# Patient Record
Sex: Male | Born: 1935 | Race: White | Hispanic: No | Marital: Married | Smoking: Never smoker
Health system: Southern US, Community
[De-identification: ages and names within clinical notes are randomized; demographics above are authoritative.]

## PROBLEM LIST (undated history)

## (undated) DIAGNOSIS — K219 Gastro-esophageal reflux disease without esophagitis: Secondary | ICD-10-CM

## (undated) DIAGNOSIS — F419 Anxiety disorder, unspecified: Secondary | ICD-10-CM

## (undated) DIAGNOSIS — I10 Essential (primary) hypertension: Secondary | ICD-10-CM

## (undated) DIAGNOSIS — C801 Malignant (primary) neoplasm, unspecified: Secondary | ICD-10-CM

## (undated) HISTORY — PX: PENILE PROSTHESIS IMPLANT: SHX240

## (undated) HISTORY — PX: BACK SURGERY: SHX140

## (undated) HISTORY — PX: OTHER SURGICAL HISTORY: SHX169

## (undated) HISTORY — PX: TRANSURETHRAL RESECTION OF BLADDER TUMOR WITH GYRUS (TURBT-GYRUS): SHX6458

## (undated) HISTORY — PX: APPENDECTOMY: SHX54

## (undated) HISTORY — PX: HERNIA REPAIR: SHX51

---

## 2015-08-01 ENCOUNTER — Other Ambulatory Visit (HOSPITAL_COMMUNITY): Payer: Self-pay | Admitting: Internal Medicine

## 2015-08-01 DIAGNOSIS — N289 Disorder of kidney and ureter, unspecified: Secondary | ICD-10-CM

## 2015-08-05 ENCOUNTER — Ambulatory Visit (HOSPITAL_COMMUNITY)
Admission: RE | Admit: 2015-08-05 | Discharge: 2015-08-05 | Disposition: A | Discharge status: Home / Self Care | Payer: Medicare Other | Source: Ambulatory Visit | Attending: Internal Medicine | Admitting: Internal Medicine

## 2015-08-05 DIAGNOSIS — N329 Bladder disorder, unspecified: Secondary | ICD-10-CM | POA: Insufficient documentation

## 2015-08-05 DIAGNOSIS — N400 Enlarged prostate without lower urinary tract symptoms: Secondary | ICD-10-CM | POA: Diagnosis not present

## 2015-08-05 DIAGNOSIS — N289 Disorder of kidney and ureter, unspecified: Secondary | ICD-10-CM | POA: Diagnosis not present

## 2015-08-05 DIAGNOSIS — N281 Cyst of kidney, acquired: Secondary | ICD-10-CM | POA: Diagnosis not present

## 2015-08-12 ENCOUNTER — Other Ambulatory Visit (HOSPITAL_COMMUNITY): Payer: Self-pay | Admitting: Ophthalmology

## 2015-08-12 DIAGNOSIS — H5340 Unspecified visual field defects: Secondary | ICD-10-CM

## 2015-08-22 ENCOUNTER — Other Ambulatory Visit (HOSPITAL_COMMUNITY): Payer: Self-pay | Admitting: Diagnostic Radiology

## 2015-08-22 ENCOUNTER — Other Ambulatory Visit (HOSPITAL_COMMUNITY): Payer: Self-pay | Admitting: Ophthalmology

## 2015-08-22 ENCOUNTER — Ambulatory Visit (HOSPITAL_COMMUNITY)
Admission: RE | Admit: 2015-08-22 | Discharge: 2015-08-22 | Disposition: A | Discharge status: Home / Self Care | Payer: Medicare Other | Source: Ambulatory Visit | Attending: Ophthalmology | Admitting: Ophthalmology

## 2015-08-22 ENCOUNTER — Ambulatory Visit (HOSPITAL_COMMUNITY)
Admission: RE | Admit: 2015-08-22 | Discharge: 2015-08-22 | Disposition: A | Discharge status: Home / Self Care | Payer: Medicare Other | Source: Ambulatory Visit | Attending: Diagnostic Radiology | Admitting: Diagnostic Radiology

## 2015-08-22 DIAGNOSIS — S0540XA Penetrating wound of orbit with or without foreign body, unspecified eye, initial encounter: Secondary | ICD-10-CM

## 2015-08-22 DIAGNOSIS — H5340 Unspecified visual field defects: Secondary | ICD-10-CM

## 2015-08-22 DIAGNOSIS — S0590XS Unspecified injury of unspecified eye and orbit, sequela: Secondary | ICD-10-CM | POA: Insufficient documentation

## 2015-08-22 DIAGNOSIS — D329 Benign neoplasm of meninges, unspecified: Secondary | ICD-10-CM | POA: Insufficient documentation

## 2015-08-22 DIAGNOSIS — G9389 Other specified disorders of brain: Secondary | ICD-10-CM | POA: Insufficient documentation

## 2015-08-22 DIAGNOSIS — H04123 Dry eye syndrome of bilateral lacrimal glands: Secondary | ICD-10-CM | POA: Diagnosis not present

## 2015-08-22 DIAGNOSIS — H527 Unspecified disorder of refraction: Secondary | ICD-10-CM | POA: Insufficient documentation

## 2015-08-22 DIAGNOSIS — Z1810 Retained metal fragments, unspecified: Secondary | ICD-10-CM | POA: Diagnosis not present

## 2015-08-22 DIAGNOSIS — H35372 Puckering of macula, left eye: Secondary | ICD-10-CM | POA: Insufficient documentation

## 2015-08-22 DIAGNOSIS — H25812 Combined forms of age-related cataract, left eye: Secondary | ICD-10-CM | POA: Diagnosis not present

## 2015-08-22 LAB — CREATININE, SERUM
Creatinine, Ser: 1.23 mg/dL (ref 0.61–1.24)
GFR calc non Af Amer: 53 mL/min — ABNORMAL LOW (ref >60)

## 2015-08-22 MED ORDER — GADOBENATE DIMEGLUMINE 529 MG/ML IV SOLN
17.0000 mL | Freq: Once | INTRAVENOUS | Status: AC | PRN
  Administered 2015-08-22: 17 mL via INTRAVENOUS

## 2015-08-23 ENCOUNTER — Other Ambulatory Visit (HOSPITAL_COMMUNITY): Payer: Medicare Other

## 2016-10-29 ENCOUNTER — Other Ambulatory Visit: Payer: Self-pay | Admitting: Urology

## 2016-11-05 NOTE — Patient Instructions (Signed)
Jay Scott  11/05/2016   Your procedure is scheduled on: 11/08/16    Report to [facility] Main  Entrance Take Jay Scott  elevators to 3rd floor to  Jay Scott at    1000 AM.   F  Call this number if you have problems the morning of surgery [phone]    Remember: ONLY 1 PERSON MAY GO WITH YOU TO SHORT STAY TO GET  READY MORNING OF YOUR SURGERY.  Do not eat food or drink liquids :After Midnight.     Take these medicines the morning of surgery with A SIP OF WATER: celexa, eye  Drops as usual                                 You may not have any metal on your body including hair pins and              piercings  Do not wear jewelry,  lotions, powders or perfumes, deodorant                        Men may shave face and neck.   Do not bring valuables to the hospital. Jay Scott.  Contacts, dentures or bridgework may not be worn into surgery.      Patients discharged the day of surgery will not be allowed to drive home.  Name and phone number of your driver:                Please read over the following fact sheets you were given: _____________________________________________________________________             Jay Scott - Preparing for Surgery Before surgery, you can play an important role.  Because skin is not sterile, your skin needs to be as free of germs as possible.  You can reduce the number of germs on your skin by washing with CHG (chlorahexidine gluconate) soap before surgery.  CHG is an antiseptic cleaner which kills germs and bonds with the skin to continue killing germs even after washing. Please DO NOT use if you have an allergy to CHG or antibacterial soaps.  If your skin becomes reddened/irritated stop using the CHG and inform your nurse when you arrive at Short Stay. Do not shave (including legs and underarms) for at least 48 hours prior to the first CHG shower.  You may shave  your face/neck. Please follow these instructions carefully:  1.  Shower with CHG Soap the night before surgery and the  morning of Surgery.  2.  If you choose to wash your hair, wash your hair first as usual with your  normal  shampoo.  3.  After you shampoo, rinse your hair and body thoroughly to remove the  shampoo.                           4.  Use CHG as you would any other liquid soap.  You can apply chg directly  to the skin and wash                       Gently with a scrungie or clean washcloth.  5.  Apply the CHG Soap to your body ONLY FROM THE NECK DOWN.   Do not use on face/ open                           Wound or open sores. Avoid contact with eyes, ears mouth and genitals (private parts).                       Wash face,  Genitals (private parts) with your normal soap.             6.  Wash thoroughly, paying special attention to the area where your surgery  will be performed.  7.  Thoroughly rinse your body with warm water from the neck down.  8.  DO NOT shower/wash with your normal soap after using and rinsing off  the CHG Soap.                9.  Pat yourself dry with a clean towel.            10.  Wear clean pajamas.            11.  Place clean sheets on your bed the night of your first shower and do not  sleep with pets. Day of Surgery : Do not apply any lotions/deodorants the morning of surgery.  Please wear clean clothes to the hospital/surgery center.  FAILURE TO FOLLOW THESE INSTRUCTIONS MAY RESULT IN THE CANCELLATION OF YOUR SURGERY PATIENT SIGNATURE_________________________________  NURSE SIGNATURE__________________________________  ________________________________________________________________________

## 2016-11-06 ENCOUNTER — Encounter (HOSPITAL_COMMUNITY)
Admission: RE | Admit: 2016-11-06 | Discharge: 2016-11-06 | Disposition: A | Discharge status: Home / Self Care | Payer: Medicare Other | Source: Ambulatory Visit | Attending: Urology | Admitting: Urology

## 2016-11-06 ENCOUNTER — Encounter (HOSPITAL_COMMUNITY): Payer: Self-pay

## 2016-11-06 DIAGNOSIS — C675 Malignant neoplasm of bladder neck: Secondary | ICD-10-CM | POA: Diagnosis present

## 2016-11-06 DIAGNOSIS — I10 Essential (primary) hypertension: Secondary | ICD-10-CM | POA: Diagnosis not present

## 2016-11-06 DIAGNOSIS — N138 Other obstructive and reflux uropathy: Secondary | ICD-10-CM | POA: Diagnosis not present

## 2016-11-06 DIAGNOSIS — Z79899 Other long term (current) drug therapy: Secondary | ICD-10-CM | POA: Diagnosis not present

## 2016-11-06 DIAGNOSIS — N320 Bladder-neck obstruction: Secondary | ICD-10-CM | POA: Diagnosis not present

## 2016-11-06 DIAGNOSIS — N401 Enlarged prostate with lower urinary tract symptoms: Secondary | ICD-10-CM | POA: Diagnosis not present

## 2016-11-06 DIAGNOSIS — Z23 Encounter for immunization: Secondary | ICD-10-CM | POA: Diagnosis not present

## 2016-11-06 DIAGNOSIS — F419 Anxiety disorder, unspecified: Secondary | ICD-10-CM | POA: Diagnosis not present

## 2016-11-06 DIAGNOSIS — K219 Gastro-esophageal reflux disease without esophagitis: Secondary | ICD-10-CM | POA: Diagnosis not present

## 2016-11-06 DIAGNOSIS — G4730 Sleep apnea, unspecified: Secondary | ICD-10-CM | POA: Diagnosis not present

## 2016-11-06 DIAGNOSIS — Z95810 Presence of automatic (implantable) cardiac defibrillator: Secondary | ICD-10-CM | POA: Diagnosis not present

## 2016-11-06 HISTORY — DX: Gastro-esophageal reflux disease without esophagitis: K21.9

## 2016-11-06 HISTORY — DX: Anxiety disorder, unspecified: F41.9

## 2016-11-06 HISTORY — DX: Essential (primary) hypertension: I10

## 2016-11-06 HISTORY — DX: Malignant (primary) neoplasm, unspecified: C80.1

## 2016-11-06 LAB — CBC
HEMATOCRIT: 44.2 % (ref 39.0–52.0)
HEMOGLOBIN: 15.7 g/dL (ref 13.0–17.0)
MCH: 34.3 pg — ABNORMAL HIGH (ref 26.0–34.0)
MCHC: 35.5 g/dL (ref 30.0–36.0)
MCV: 96.5 fL (ref 78.0–100.0)
Platelets: 230 10*3/uL (ref 150–400)
RBC: 4.58 MIL/uL (ref 4.22–5.81)
RDW: 13 % (ref 11.5–15.5)
WBC: 8.6 10*3/uL (ref 4.0–10.5)

## 2016-11-06 LAB — BASIC METABOLIC PANEL
ANION GAP: 8 (ref 5–15)
BUN: 33 mg/dL — ABNORMAL HIGH (ref 6–20)
CHLORIDE: 103 mmol/L (ref 101–111)
CO2: 27 mmol/L (ref 22–32)
Calcium: 9.3 mg/dL (ref 8.9–10.3)
Creatinine, Ser: 1.16 mg/dL (ref 0.61–1.24)
GFR calc non Af Amer: 57 mL/min — ABNORMAL LOW (ref >60)
GLUCOSE: 159 mg/dL — AB (ref 65–99)
Potassium: 4.8 mmol/L (ref 3.5–5.1)
Sodium: 138 mmol/L (ref 135–145)

## 2016-11-06 NOTE — Progress Notes (Signed)
BMP done 11/06/16 routed via epic to dr Jeffie Pollock.

## 2016-11-06 NOTE — H&P (Signed)
CC/HPI: I have symptoms of an enlarged prostate.     Mr. Jay Scott is an 83 yo WM who presents for increased voiding symptoms. He has had progressive symptoms over the last 3-4 years and he has nocturia 6x most nights. He has been on flomax for a couple of months with some improvement. He has no hesitancy and a good stream. He has some urgency and can have some accidents. He has some intermittency and doesn't always feel empty. He has had no hematuria or dysuria. 3-4 days ago he did have a single episode of dysuria and that was after increased soda intake. He had chlamydia about 6 years ago that was treated. He has had bladder cancer that was treated about 6 years ago. He had 3 low grade tumors. He last had cystoscopy a year ago.     ALLERGIES: None   MEDICATIONS: Flomax 0.4 mg capsule, ext release 24 hr  Losartan Potassium 100 mg tablet  Spironolactone 25 mg tablet  Tylenol 325 mg tablet     GU PSH: Cystoscopy - about 10/23/2015 Cystoscopy TURBT <2 cm - about 2012 Insertion IPP - about 2016, about 2003      PSH Notes: right eye enucleation 1994   NON-GU PSH: Back surgery Brain surgery - about 1995 Skin Biopsy    GU PMH: None   NON-GU PMH: Basal cell carcinoma of skin of unspecified parts of face Benign neoplasm of meninges, unspecified Depression Hypertension Sleep Apnea    FAMILY HISTORY: 1 Daughter - Daughter 3 Son's - Son   SOCIAL HISTORY: Marital Status: Married Preferred Language: English; Race: White Current Smoking Status: Patient has never smoked.   Tobacco Use Assessment Completed: Used Tobacco in last 30 days? Drinks 1 drink per day. Types of alcohol consumed: Beer. Social Drinker.  Drinks 2 caffeinated drinks per day.     Notes: Retired Pharmacist, community.    REVIEW OF SYSTEMS:    GU Review Male:   Patient reports frequent urination, hard to postpone urination, get up at night to urinate, leakage of urine, and stream starts and stops. Patient denies burning/ pain  with urination, trouble starting your stream, have to strain to urinate , and penile pain.  Gastrointestinal (Upper):   Patient denies nausea, vomiting, and indigestion/ heartburn.  Gastrointestinal (Lower):   Patient denies diarrhea and constipation.  Constitutional:   Patient denies fever, night sweats, weight loss, and fatigue.  Skin:   Patient denies skin rash/ lesion and itching.  Eyes:   Patient reports blurred vision. Patient denies double vision.  Ears/ Nose/ Throat:   Patient denies sore throat and sinus problems.  Hematologic/Lymphatic:   Patient denies swollen glands and easy bruising.  Cardiovascular:   Patient denies leg swelling and chest pains.  Respiratory:   Patient denies cough and shortness of breath.  Endocrine:   Patient denies excessive thirst.  Musculoskeletal:   Patient reports back pain and joint pain.   Neurological:   Patient reports dizziness. Patient denies headaches.  Psychologic:   Patient denies depression and anxiety.   VITAL SIGNS:      10/24/2016 03:00 PM  Weight 169.7 lb / 76.97 kg  Height 69 in / 175.26 cm  BP 104/65 mmHg  Pulse 61 /min  BMI 25.1 kg/m   GU PHYSICAL EXAMINATION:    Anus and Perineum: No hemorrhoids. No anal stenosis. No rectal fissure, no anal fissure. No edema, no dimple, no perineal tenderness, no anal tenderness.  Scrotum: No lesions. No edema. No cysts. No warts.  IPP pump in the right hemiscrotum.   Epididymides: Right: no spermatocele, no masses, no cysts, no tenderness, no induration, no enlargement. Left: no spermatocele, no masses, no cysts, no tenderness, no induration, no enlargement.  Testes: No tenderness, no swelling, no enlargement left testes. No tenderness, no swelling, no enlargement right testes. Normal location left testes. Normal location right testes. No mass, no cyst, no varicocele, no hydrocele left testes. No mass, no cyst, no varicocele, no hydrocele right testes.  Urethral Meatus: Normal size. No lesion, no  wart, no discharge, no polyp. Normal location.  Penis: Circumcised, no foreskin warts, no cracks. No dorsal peyronie's plaques, no left corporal peyronie's plaques, no right corporal peyronie's plaques, no scarring, no shaft warts. No balanitis, no meatal stenosis. inflatable prosthesis in place.   Prostate: Prostate 3 + size. Left lobe normal consistency, right lobe normal consistency. Symmetrical lobes. No prostate nodule. Left lobe no tenderness, right lobe no tenderness.   Seminal Vesicles: Nonpalpable.  Sphincter Tone: Normal sphincter. No rectal tenderness. No rectal mass.    MULTI-SYSTEM PHYSICAL EXAMINATION:    Constitutional: Well-nourished. No physical deformities. Normally developed. Good grooming.  Neck: Neck symmetrical, not swollen. Normal tracheal position.  Respiratory: No labored breathing, no use of accessory muscles.   Cardiovascular: Normal temperature, normal extremity pulses, no swelling, no varicosities.  Lymphatic: No enlargement of neck, axillae, groin.  Skin: No paleness, no jaundice, no cyanosis. No lesion, no ulcer, no rash.  Neurologic / Psychiatric: Oriented to time, oriented to place, oriented to person. No depression, no anxiety, no agitation.  Gastrointestinal: No mass, no tenderness, no rigidity, non obese abdomen. he has right and left inguinal incisions for the IPP.   Eyes: Normal conjunctivae. Normal eyelids.  Ears, Nose, Mouth, and Throat: Left ear no scars, no lesions, no masses. Right ear no scars, no lesions, no masses. Nose no scars, no lesions, no masses. Normal hearing. Normal lips.  Musculoskeletal: Normal gait and station of head and neck.     PAST DATA REVIEWED:  Source Of History:  Patient  Urine Test Review:   Urinalysis  X-Ray Review: Renal Ultrasound: Reviewed Report. Discussed With Patient.     PROCEDURES:         Flexible Cystoscopy - 52000  Risks, benefits, and some of the potential complications of the procedure were discussed. 97ml  of 2% lidocaine jelly was instilled intraurethrally.  Cipro 500mg  given for antibiotic prophylaxis.     Meatus:  Normal size. Normal location. Normal condition.  Urethra:  No strictures.  External Sphincter:  Normal.  Verumontanum:  Normal.  Prostate:  Obstructing. Enlarged median lobe. Severe hyperplasia. with shaggy mucosa on the middle lobe and bladder neck that could be CIS.   Bladder Neck:  Non-obstructing.  Ureteral Orifices:  Normal location. Normal size. Normal shape. Effluxed clear urine.  Bladder:  Moderate trabeculation. A posterior wall tumor. Erythematous mucosa. the posterior wall tumor is most consistent with CIS and is about 1-1.5cm. There are a couple of other smaller areas in the surrounding posterior wall that are also suspicious for CIS. No stones.      The procedure was well tolerated and there were no complications.          Flow Rate - 51741  Flow Time: 0:26 min:sec  Voided Volume: 152 cc  Peak Flow Rate: 7 cc/sec  Time of Peak Flow: 0:09 min:sec  Average Flow Rate: 5 cc/sec  Total Void Time: 0:26 min:sec  PVR Ultrasound - 36644  Scanned Volume: 78 cc         Urinalysis Dipstick Dipstick Cont'd  Color: Yellow Bilirubin: Neg  Appearance: Clear Ketones: Neg  Specific Gravity: 1.025 Blood: Neg  pH: 6.0 Protein: Neg  Glucose: Neg Urobilinogen: 0.2    Nitrites: Neg    Leukocyte Esterase: Neg    ASSESSMENT:      ICD-10 Details  1 GU:   BPH w/LUTS - N40.1 He has a large prostate with trilobar obstruction. He will need at least partial resection because of the mucosal abnormalities.   2   Nocturia - R35.1 The OAB is probably a combination of the BPH with BOO and recurrent bladder cancer.   3   Bladder Cancer Posterior - C67.4 He has a small papillary tumor with surrounding changes consistent with CIS. He is going to need cystoscopy with retrogrades and a TURBT. I am going to try to get him back in for a CT hematuria study this week to assess the  upper tracks and r/o mets.   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 Hematuria With and Without I.V. Contrast - Needs to be done this week.  Return Visit/Planned Activity: ASAP - Schedule Surgery

## 2016-11-06 NOTE — Progress Notes (Signed)
Final EKG done 11/06/16-epic

## 2016-11-07 NOTE — H&P (Signed)
CC/HPI: I have symptoms of an enlarged prostate.     Jay Scott is an 83 yo WM who presents for increased voiding symptoms. He has had progressive symptoms over the last 3-4 years and he has nocturia 6x most nights. He has been on flomax for a couple of months with some improvement. He has no hesitancy and a good stream. He has some urgency and can have some accidents. He has some intermittency and doesn't always feel empty. He has had no hematuria or dysuria. 3-4 days ago he did have a single episode of dysuria and that was after increased soda intake. He had chlamydia about 6 years ago that was treated. He has had bladder cancer that was treated about 6 years ago. He had 3 low grade tumors. He last had cystoscopy a year ago.     ALLERGIES: None   MEDICATIONS: Flomax 0.4 mg capsule, ext release 24 hr  Losartan Potassium 100 mg tablet  Spironolactone 25 mg tablet  Tylenol 325 mg tablet     GU PSH: Cystoscopy - about 10/23/2015 Cystoscopy TURBT <2 cm - about 2012 Insertion IPP - about 2016, about 2003      PSH Notes: right eye enucleation 1994   NON-GU PSH: Back surgery Brain surgery - about 1995 Skin Biopsy    GU PMH: None   NON-GU PMH: Basal cell carcinoma of skin of unspecified parts of face Benign neoplasm of meninges, unspecified Depression Hypertension Sleep Apnea    FAMILY HISTORY: 1 Daughter - Daughter 3 Son's - Son   SOCIAL HISTORY: Marital Status: Married Preferred Language: English; Race: White Current Smoking Status: Patient has never smoked.   Tobacco Use Assessment Completed: Used Tobacco in last 30 days? Drinks 1 drink per day. Types of alcohol consumed: Beer. Social Drinker.  Drinks 2 caffeinated drinks per day.     Notes: Retired Pharmacist, community.    REVIEW OF SYSTEMS:    GU Review Male:   Patient reports frequent urination, hard to postpone urination, get up at night to urinate, leakage of urine, and stream starts and stops. Patient denies burning/ pain  with urination, trouble starting your stream, have to strain to urinate , and penile pain.  Gastrointestinal (Upper):   Patient denies nausea, vomiting, and indigestion/ heartburn.  Gastrointestinal (Lower):   Patient denies diarrhea and constipation.  Constitutional:   Patient denies fever, night sweats, weight loss, and fatigue.  Skin:   Patient denies skin rash/ lesion and itching.  Eyes:   Patient reports blurred vision. Patient denies double vision.  Ears/ Nose/ Throat:   Patient denies sore throat and sinus problems.  Hematologic/Lymphatic:   Patient denies swollen glands and easy bruising.  Cardiovascular:   Patient denies leg swelling and chest pains.  Respiratory:   Patient denies cough and shortness of breath.  Endocrine:   Patient denies excessive thirst.  Musculoskeletal:   Patient reports back pain and joint pain.   Neurological:   Patient reports dizziness. Patient denies headaches.  Psychologic:   Patient denies depression and anxiety.   VITAL SIGNS:      10/24/2016 03:00 PM  Weight 169.7 lb / 76.97 kg  Height 69 in / 175.26 cm  BP 104/65 mmHg  Pulse 61 /min  BMI 25.1 kg/m   GU PHYSICAL EXAMINATION:    Anus and Perineum: No hemorrhoids. No anal stenosis. No rectal fissure, no anal fissure. No edema, no dimple, no perineal tenderness, no anal tenderness.  Scrotum: No lesions. No edema. No cysts. No warts.  IPP pump in the right hemiscrotum.   Epididymides: Right: no spermatocele, no masses, no cysts, no tenderness, no induration, no enlargement. Left: no spermatocele, no masses, no cysts, no tenderness, no induration, no enlargement.  Testes: No tenderness, no swelling, no enlargement left testes. No tenderness, no swelling, no enlargement right testes. Normal location left testes. Normal location right testes. No mass, no cyst, no varicocele, no hydrocele left testes. No mass, no cyst, no varicocele, no hydrocele right testes.  Urethral Meatus: Normal size. No lesion, no  wart, no discharge, no polyp. Normal location.  Penis: Circumcised, no foreskin warts, no cracks. No dorsal peyronie's plaques, no left corporal peyronie's plaques, no right corporal peyronie's plaques, no scarring, no shaft warts. No balanitis, no meatal stenosis. inflatable prosthesis in place.   Prostate: Prostate 3 + size. Left lobe normal consistency, right lobe normal consistency. Symmetrical lobes. No prostate nodule. Left lobe no tenderness, right lobe no tenderness.   Seminal Vesicles: Nonpalpable.  Sphincter Tone: Normal sphincter. No rectal tenderness. No rectal mass.    MULTI-SYSTEM PHYSICAL EXAMINATION:    Constitutional: Well-nourished. No physical deformities. Normally developed. Good grooming.  Neck: Neck symmetrical, not swollen. Normal tracheal position.  Respiratory: No labored breathing, no use of accessory muscles.   Cardiovascular: Normal temperature, normal extremity pulses, no swelling, no varicosities.  Lymphatic: No enlargement of neck, axillae, groin.  Skin: No paleness, no jaundice, no cyanosis. No lesion, no ulcer, no rash.  Neurologic / Psychiatric: Oriented to time, oriented to place, oriented to person. No depression, no anxiety, no agitation.  Gastrointestinal: No mass, no tenderness, no rigidity, non obese abdomen. he has right and left inguinal incisions for the IPP.   Eyes: Normal conjunctivae. Normal eyelids.  Ears, Nose, Mouth, and Throat: Left ear no scars, no lesions, no masses. Right ear no scars, no lesions, no masses. Nose no scars, no lesions, no masses. Normal hearing. Normal lips.  Musculoskeletal: Normal gait and station of head and neck.     PAST DATA REVIEWED:  Source Of History:  Patient  Urine Test Review:   Urinalysis  X-Ray Review: Renal Ultrasound: Reviewed Report. Discussed With Patient.     PROCEDURES:         Flexible Cystoscopy - 52000  Risks, benefits, and some of the potential complications of the procedure were discussed. 91ml  of 2% lidocaine jelly was instilled intraurethrally.  Cipro 500mg  given for antibiotic prophylaxis.     Meatus:  Normal size. Normal location. Normal condition.  Urethra:  No strictures.  External Sphincter:  Normal.  Verumontanum:  Normal.  Prostate:  Obstructing. Enlarged median lobe. Severe hyperplasia. with shaggy mucosa on the middle lobe and bladder neck that could be CIS.   Bladder Neck:  Non-obstructing.  Ureteral Orifices:  Normal location. Normal size. Normal shape. Effluxed clear urine.  Bladder:  Moderate trabeculation. A posterior wall tumor. Erythematous mucosa. the posterior wall tumor is most consistent with CIS and is about 1-1.5cm. There are a couple of other smaller areas in the surrounding posterior wall that are also suspicious for CIS. No stones.      The procedure was well tolerated and there were no complications.          Flow Rate - 51741  Flow Time: 0:26 min:sec  Voided Volume: 152 cc  Peak Flow Rate: 7 cc/sec  Time of Peak Flow: 0:09 min:sec  Average Flow Rate: 5 cc/sec  Total Void Time: 0:26 min:sec  PVR Ultrasound - 58832  Scanned Volume: 78 cc         Urinalysis Dipstick Dipstick Cont'd  Color: Yellow Bilirubin: Neg  Appearance: Clear Ketones: Neg  Specific Gravity: 1.025 Blood: Neg  pH: 6.0 Protein: Neg  Glucose: Neg Urobilinogen: 0.2    Nitrites: Neg    Leukocyte Esterase: Neg    ASSESSMENT:      ICD-10 Details  1 GU:   BPH w/LUTS - N40.1 He has a large prostate with trilobar obstruction. He will need at least partial resection because of the mucosal abnormalities.   2   Nocturia - R35.1 The OAB is probably a combination of the BPH with BOO and recurrent bladder cancer.   3   Bladder Cancer Posterior - C67.4 He has a small papillary tumor with surrounding changes consistent with CIS. He is going to need cystoscopy with retrogrades and a TURBT. I am going to try to get him back in for a CT hematuria study this week to assess the  upper tracks and r/o mets.   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 Hematuria With and Without I.V. Contrast - Needs to be done this week.  Return Visit/Planned Activity: ASAP - Schedule Surgery    CT showed no upper tract abnormalities.   There was bladder wall thickening.  He has indeterminate liver and lung lesions and 3 month repeat CT's were recommended and have been ordered.

## 2016-11-08 ENCOUNTER — Ambulatory Visit (HOSPITAL_COMMUNITY): Payer: Medicare Other | Admitting: Anesthesiology

## 2016-11-08 ENCOUNTER — Encounter (HOSPITAL_COMMUNITY)
Admission: RE | Disposition: A | Discharge status: Home / Self Care | Payer: Self-pay | Source: Ambulatory Visit | Attending: Urology

## 2016-11-08 ENCOUNTER — Encounter (HOSPITAL_COMMUNITY): Payer: Self-pay | Admitting: Anesthesiology

## 2016-11-08 ENCOUNTER — Observation Stay (HOSPITAL_COMMUNITY)
Admission: RE | Admit: 2016-11-08 | Discharge: 2016-11-09 | Disposition: A | Discharge status: Home / Self Care | Payer: Medicare Other | Source: Ambulatory Visit | Attending: Urology | Admitting: Urology

## 2016-11-08 DIAGNOSIS — C675 Malignant neoplasm of bladder neck: Secondary | ICD-10-CM | POA: Diagnosis not present

## 2016-11-08 DIAGNOSIS — Z23 Encounter for immunization: Secondary | ICD-10-CM | POA: Diagnosis not present

## 2016-11-08 DIAGNOSIS — N401 Enlarged prostate with lower urinary tract symptoms: Secondary | ICD-10-CM | POA: Diagnosis not present

## 2016-11-08 DIAGNOSIS — Z79899 Other long term (current) drug therapy: Secondary | ICD-10-CM | POA: Insufficient documentation

## 2016-11-08 DIAGNOSIS — N320 Bladder-neck obstruction: Secondary | ICD-10-CM | POA: Insufficient documentation

## 2016-11-08 DIAGNOSIS — N138 Other obstructive and reflux uropathy: Secondary | ICD-10-CM | POA: Insufficient documentation

## 2016-11-08 DIAGNOSIS — I10 Essential (primary) hypertension: Secondary | ICD-10-CM | POA: Insufficient documentation

## 2016-11-08 DIAGNOSIS — G4730 Sleep apnea, unspecified: Secondary | ICD-10-CM | POA: Insufficient documentation

## 2016-11-08 DIAGNOSIS — K219 Gastro-esophageal reflux disease without esophagitis: Secondary | ICD-10-CM | POA: Insufficient documentation

## 2016-11-08 DIAGNOSIS — Z95810 Presence of automatic (implantable) cardiac defibrillator: Secondary | ICD-10-CM | POA: Insufficient documentation

## 2016-11-08 DIAGNOSIS — F419 Anxiety disorder, unspecified: Secondary | ICD-10-CM | POA: Insufficient documentation

## 2016-11-08 HISTORY — PX: TRANSURETHRAL RESECTION OF BLADDER TUMOR: SHX2575

## 2016-11-08 HISTORY — PX: TRANSURETHRAL RESECTION OF PROSTATE: SHX73

## 2016-11-08 SURGERY — TURP (TRANSURETHRAL RESECTION OF PROSTATE)
Anesthesia: General

## 2016-11-08 MED ORDER — LOSARTAN POTASSIUM 50 MG PO TABS
100.0000 mg | ORAL_TABLET | Freq: Every day | ORAL | Status: DC
  Administered 2016-11-08 – 2016-11-09 (×2): 100 mg via ORAL
  Filled 2016-11-08 (×2): qty 2

## 2016-11-08 MED ORDER — OXYCODONE HCL 5 MG PO TABS: 5.0000 mg | ORAL_TABLET | ORAL | Status: DC | PRN

## 2016-11-08 MED ORDER — SPIRONOLACTONE 25 MG PO TABS
25.0000 mg | ORAL_TABLET | Freq: Every day | ORAL | Status: DC
  Administered 2016-11-08 – 2016-11-09 (×2): 25 mg via ORAL
  Filled 2016-11-08 (×2): qty 1

## 2016-11-08 MED ORDER — ZOLPIDEM TARTRATE 5 MG PO TABS
5.0000 mg | ORAL_TABLET | Freq: Every evening | ORAL | Status: DC | PRN
  Administered 2016-11-08: 5 mg via ORAL
  Filled 2016-11-08: qty 1

## 2016-11-08 MED ORDER — FENTANYL CITRATE (PF) 100 MCG/2ML IJ SOLN: 25.0000 ug | INTRAMUSCULAR | Status: DC | PRN

## 2016-11-08 MED ORDER — ONDANSETRON HCL 4 MG/2ML IJ SOLN
INTRAMUSCULAR | Status: AC
  Filled 2016-11-08: qty 2

## 2016-11-08 MED ORDER — ONDANSETRON HCL 4 MG/2ML IJ SOLN
INTRAMUSCULAR | Status: DC | PRN
  Administered 2016-11-08: 4 mg via INTRAVENOUS

## 2016-11-08 MED ORDER — CEPHALEXIN 500 MG PO CAPS
500.0000 mg | ORAL_CAPSULE | Freq: Three times a day (TID) | ORAL | Status: DC
  Administered 2016-11-08 – 2016-11-09 (×3): 500 mg via ORAL
  Filled 2016-11-08 (×3): qty 1

## 2016-11-08 MED ORDER — FENTANYL CITRATE (PF) 100 MCG/2ML IJ SOLN
INTRAMUSCULAR | Status: AC
  Filled 2016-11-08: qty 2

## 2016-11-08 MED ORDER — PROPOFOL 10 MG/ML IV BOLUS
INTRAVENOUS | Status: DC | PRN
  Administered 2016-11-08: 160 mg via INTRAVENOUS

## 2016-11-08 MED ORDER — LIDOCAINE 2% (20 MG/ML) 5 ML SYRINGE
INTRAMUSCULAR | Status: AC
  Filled 2016-11-08: qty 5

## 2016-11-08 MED ORDER — INFLUENZA VAC SPLIT HIGH-DOSE 0.5 ML IM SUSY
0.5000 mL | PREFILLED_SYRINGE | INTRAMUSCULAR | Status: AC
  Administered 2016-11-09: 0.5 mL via INTRAMUSCULAR
  Filled 2016-11-08: qty 0.5

## 2016-11-08 MED ORDER — DEXAMETHASONE SODIUM PHOSPHATE 10 MG/ML IJ SOLN
INTRAMUSCULAR | Status: AC
  Filled 2016-11-08: qty 1

## 2016-11-08 MED ORDER — DEXAMETHASONE SODIUM PHOSPHATE 10 MG/ML IJ SOLN
INTRAMUSCULAR | Status: DC | PRN
Start: 2016-11-08 — End: 2016-11-08
  Administered 2016-11-08: 10 mg via INTRAVENOUS

## 2016-11-08 MED ORDER — CEFAZOLIN SODIUM-DEXTROSE 2-4 GM/100ML-% IV SOLN
2.0000 g | INTRAVENOUS | Status: AC
  Administered 2016-11-08: 2 g via INTRAVENOUS
  Filled 2016-11-08: qty 100

## 2016-11-08 MED ORDER — SENNOSIDES-DOCUSATE SODIUM 8.6-50 MG PO TABS: 1.0000 | ORAL_TABLET | Freq: Every evening | ORAL | Status: DC | PRN

## 2016-11-08 MED ORDER — DIPHENHYDRAMINE HCL 12.5 MG/5ML PO ELIX: 12.5000 mg | ORAL_SOLUTION | Freq: Four times a day (QID) | ORAL | Status: DC | PRN

## 2016-11-08 MED ORDER — LACTATED RINGERS IV SOLN
INTRAVENOUS | Status: DC
  Administered 2016-11-08: 11:00:00 via INTRAVENOUS

## 2016-11-08 MED ORDER — LIDOCAINE 2% (20 MG/ML) 5 ML SYRINGE
INTRAMUSCULAR | Status: DC | PRN
  Administered 2016-11-08: 80 mg via INTRAVENOUS

## 2016-11-08 MED ORDER — BISACODYL 10 MG RE SUPP: 10.0000 mg | Freq: Every day | RECTAL | Status: DC | PRN

## 2016-11-08 MED ORDER — ONDANSETRON HCL 4 MG/2ML IJ SOLN: 4.0000 mg | INTRAMUSCULAR | Status: DC | PRN

## 2016-11-08 MED ORDER — DOCUSATE SODIUM 100 MG PO CAPS: 100.0000 mg | ORAL_CAPSULE | Freq: Two times a day (BID) | ORAL | 2 refills | Status: AC

## 2016-11-08 MED ORDER — DOCUSATE SODIUM 100 MG PO CAPS
100.0000 mg | ORAL_CAPSULE | Freq: Two times a day (BID) | ORAL | Status: DC
  Administered 2016-11-08 – 2016-11-09 (×2): 100 mg via ORAL
  Filled 2016-11-08 (×2): qty 1

## 2016-11-08 MED ORDER — FENTANYL CITRATE (PF) 100 MCG/2ML IJ SOLN
INTRAMUSCULAR | Status: DC | PRN
  Administered 2016-11-08 (×4): 25 ug via INTRAVENOUS

## 2016-11-08 MED ORDER — PROPOFOL 10 MG/ML IV BOLUS
INTRAVENOUS | Status: AC
  Filled 2016-11-08: qty 20

## 2016-11-08 MED ORDER — HYOSCYAMINE SULFATE 0.125 MG SL SUBL
0.1250 mg | SUBLINGUAL_TABLET | SUBLINGUAL | Status: DC | PRN
  Filled 2016-11-08: qty 1

## 2016-11-08 MED ORDER — ACETAMINOPHEN ER 650 MG PO TBCR: 1300.0000 mg | EXTENDED_RELEASE_TABLET | Freq: Two times a day (BID) | ORAL | Status: DC

## 2016-11-08 MED ORDER — ROCURONIUM BROMIDE 50 MG/5ML IV SOSY
PREFILLED_SYRINGE | INTRAVENOUS | Status: AC
  Filled 2016-11-08: qty 5

## 2016-11-08 MED ORDER — POTASSIUM CHLORIDE IN NACL 20-0.45 MEQ/L-% IV SOLN
INTRAVENOUS | Status: DC
  Administered 2016-11-08 – 2016-11-09 (×2): via INTRAVENOUS
  Filled 2016-11-08 (×3): qty 1000

## 2016-11-08 MED ORDER — EPHEDRINE 5 MG/ML INJ
INTRAVENOUS | Status: AC
  Filled 2016-11-08: qty 10

## 2016-11-08 MED ORDER — DIPHENHYDRAMINE HCL 50 MG/ML IJ SOLN: 12.5000 mg | Freq: Four times a day (QID) | INTRAMUSCULAR | Status: DC | PRN

## 2016-11-08 MED ORDER — CYCLOSPORINE 0.05 % OP EMUL
1.0000 [drp] | Freq: Two times a day (BID) | OPHTHALMIC | Status: DC | PRN
  Filled 2016-11-08: qty 1

## 2016-11-08 MED ORDER — ACETAMINOPHEN 325 MG PO TABS
650.0000 mg | ORAL_TABLET | ORAL | Status: DC | PRN
  Administered 2016-11-08: 650 mg via ORAL
  Filled 2016-11-08: qty 2

## 2016-11-08 MED ORDER — SODIUM CHLORIDE 0.9 % IR SOLN
Status: DC | PRN
  Administered 2016-11-08 (×2): 12000 mL via INTRAVESICAL

## 2016-11-08 MED ORDER — HYDROMORPHONE HCL 1 MG/ML IJ SOLN: 0.5000 mg | INTRAMUSCULAR | Status: DC | PRN

## 2016-11-08 MED ORDER — EPHEDRINE SULFATE-NACL 50-0.9 MG/10ML-% IV SOSY
PREFILLED_SYRINGE | INTRAVENOUS | Status: DC | PRN
  Administered 2016-11-08 (×3): 5 mg via INTRAVENOUS

## 2016-11-08 MED ORDER — STERILE WATER FOR IRRIGATION IR SOLN
Status: DC | PRN
Start: 2016-11-08 — End: 2016-11-08
  Administered 2016-11-08: 500 mL

## 2016-11-08 MED ORDER — TRAMADOL HCL 50 MG PO TABS: 50.0000 mg | ORAL_TABLET | Freq: Four times a day (QID) | ORAL | 0 refills | Status: AC | PRN

## 2016-11-08 MED ORDER — CITALOPRAM HYDROBROMIDE 20 MG PO TABS
10.0000 mg | ORAL_TABLET | Freq: Every day | ORAL | Status: DC
  Administered 2016-11-09: 10 mg via ORAL
  Filled 2016-11-08: qty 1

## 2016-11-08 MED ORDER — FLEET ENEMA 7-19 GM/118ML RE ENEM: 1.0000 | ENEMA | Freq: Once | RECTAL | Status: DC | PRN

## 2016-11-08 SURGICAL SUPPLY — 32 items
BAG URINE DRAINAGE (UROLOGICAL SUPPLIES) ×4 IMPLANT
BAG URO CATCHER STRL LF (MISCELLANEOUS) ×4 IMPLANT
BASKET STONE NCOMPASS (UROLOGICAL SUPPLIES) IMPLANT
CATH FOLEY 3WAY 30CC 22FR (CATHETERS) ×4 IMPLANT
CATH URET 5FR 28IN OPEN ENDED (CATHETERS) IMPLANT
CATH URET DUAL LUMEN 6-10FR 50 (CATHETERS) IMPLANT
CLOTH BEACON ORANGE TIMEOUT ST (SAFETY) IMPLANT
COVER FOOTSWITCH UNIV (MISCELLANEOUS) IMPLANT
COVER SURGICAL LIGHT HANDLE (MISCELLANEOUS) ×4 IMPLANT
ELECT REM PT RETURN 15FT ADLT (MISCELLANEOUS) IMPLANT
EXTRACTOR STONE NITINOL NGAGE (UROLOGICAL SUPPLIES) IMPLANT
FIBER LASER FLEXIVA 1000 (UROLOGICAL SUPPLIES) IMPLANT
FIBER LASER FLEXIVA 365 (UROLOGICAL SUPPLIES) IMPLANT
FIBER LASER FLEXIVA 550 (UROLOGICAL SUPPLIES) IMPLANT
FIBER LASER TRAC TIP (UROLOGICAL SUPPLIES) IMPLANT
GLOVE SURG SS PI 8.0 STRL IVOR (GLOVE) IMPLANT
GOWN STRL REUS W/TWL XL LVL3 (GOWN DISPOSABLE) ×4 IMPLANT
GUIDEWIRE STR DUAL SENSOR (WIRE) IMPLANT
HOLDER FOLEY CATH W/STRAP (MISCELLANEOUS) ×4 IMPLANT
IV NS 1000ML (IV SOLUTION)
IV NS 1000ML BAXH (IV SOLUTION) IMPLANT
IV NS IRRIG 3000ML ARTHROMATIC (IV SOLUTION) IMPLANT
LOOP CUT BIPOLAR 24F LRG (ELECTROSURGICAL) ×4 IMPLANT
MANIFOLD NEPTUNE II (INSTRUMENTS) ×4 IMPLANT
PACK CYSTO (CUSTOM PROCEDURE TRAY) ×4 IMPLANT
SET ASPIRATION TUBING (TUBING) IMPLANT
SHEATH ACCESS URETERAL 38CM (SHEATH) IMPLANT
SUT ETHILON 3 0 PS 1 (SUTURE) IMPLANT
SYR 30ML LL (SYRINGE) ×4 IMPLANT
SYRINGE IRR TOOMEY STRL 70CC (SYRINGE) ×4 IMPLANT
TUBING CONNECTING 10 (TUBING) ×3 IMPLANT
TUBING CONNECTING 10' (TUBING) ×1

## 2016-11-08 NOTE — Discharge Instructions (Signed)
Transurethral Resection of the Prostate, Care After °Refer to this sheet in the next few weeks. These instructions provide you with information about caring for yourself after your procedure. Your health care provider may also give you more specific instructions. Your treatment has been planned according to current medical practices, but problems sometimes occur. Call your health care provider if you have any problems or questions after your procedure. °What can I expect after the procedure? °After the procedure, it is common to have: °· Mild pain in your lower abdomen. °· Soreness or mild discomfort in your penis from having the catheter inserted during the procedure. °· A feeling of urgency when you need to urinate. °· A small amount of blood in your urine. You may notice some small blood clots in your urine. These are normal. ° °Follow these instructions at home: °Medicines ° °· Take over-the-counter and prescription medicines only as told by your health care provider. °· Do not drive or operate heavy machinery while taking prescription pain medicine. °· Do not drive for 24 hours if you received a sedative. °· If you were prescribed antibiotic medicine, take it as told by your health care provider. Do not stop taking the antibiotic even if you start to feel better. °Activity °· Return to your normal activities as told by your health care provider. Ask your health care provider what activities are safe for you. °· Do not lift anything that is heavier than 10 lb (4.5 kg) for 3 weeks after your procedure, or as long as told by your health care provider. °· Avoid intense physical activity for as long as told by your health care provider. °· Walk at least one time every day. This helps to prevent blood clots. You may increase your physical activity gradually as you start to feel better. °Lifestyle °· Do not drink alcohol for as long as told by your health care provider. This is especially important if you are taking  prescription pain medicines. °· Do not engage in sexual activity until your health care provider says that you can do this. °General instructions °· Do not take baths, swim, or use a hot tub until your health care provider approves. °· Drink enough fluid to keep your urine clear or pale yellow. °· Urinate as soon as you feel the need to. Do not try to hold your urine for long periods of time. °· If your health care provider approves, you may take a stool softener for 2-3 weeks to prevent you from straining to have a bowel movement. °· Wear compression stockings as told by your health care provider. These stockings help to prevent blood clots and reduce swelling in your legs. °· Keep all follow-up visits as told by your health care provider. This is important. °Contact a health care provider if: °· You have difficulty urinating. °· You have a fever. °· You have pain that gets worse or does not improve with medicine. °· You have blood in your urine that does not go away after 1 week of resting and drinking more fluids. °· You have swelling in your penis or testicles. °Get help right away if: °· You are unable to urinate. °· You are having more blood clots in your urine instead of fewer. °· You have: °? Large blood clots. °? A lot of blood in your urine. °? Pain in your back or lower abdomen. °? Pain or swelling in your legs. °? Chills and you are shaking. °This information is not intended to   replace advice given to you by your health care provider. Make sure you discuss any questions you have with your health care provider. °Document Released: 01/08/2005 Document Revised: 09/11/2015 Document Reviewed: 09/30/2014 °[employer] Interactive Patient Education © 2017 [employer] ° °

## 2016-11-08 NOTE — Anesthesia Procedure Notes (Signed)
Procedure Name: LMA Insertion Date/Time: 11/08/2016 11:50 AM Performed by: Danley Danker L Patient Re-evaluated:Patient Re-evaluated prior to induction Oxygen Delivery Method: Circle system utilized Preoxygenation: Pre-oxygenation with 100% oxygen Induction Type: IV induction Ventilation: Mask ventilation without difficulty LMA: LMA inserted LMA Size: 4.0 Number of attempts: 1 Placement Confirmation: positive ETCO2 and breath sounds checked- equal and bilateral Tube secured with: Tape Dental Injury: Teeth and Oropharynx as per pre-operative assessment

## 2016-11-08 NOTE — Progress Notes (Signed)
Patient ID: Jay Scott, male   DOB: [birth year], 83 y.o.   MRN: [id number]   He was doing well post op at 445pm.   Urine was clear on CBI and he was without complaints.  BP 140/65 (BP Location: Right Arm)   Pulse (!) 56   Temp 97.8 F (36.6 C) (Oral)   Resp 16   Ht 5' 8.5" (1.74 m)   Wt 75.3 kg (166 lb 0.1 oz)   SpO2 97%   BMI 24.87 kg/m    Plan is for discharge home in AM with foley.   He will remove on Monday and knows how.  He has f/u with me on 11/1.

## 2016-11-08 NOTE — Op Note (Signed)
NAME:  Jay Scott, Jay Scott NO.:  MEDICAL RECORD NO.:  [id number]  LOCATION:                                 FACILITY:  PHYSICIAN:  Marshall Cork. Jeffie Pollock, M.D.         DATE OF BIRTH:  DATE OF PROCEDURE:  11/08/2016 DATE OF DISCHARGE:                              OPERATIVE REPORT   PROCEDURE: 1. Cystoscopy with transurethral resection of large bladder tumor from     bladder neck. 2. Cystoscopy with fulguration of 1.5 to 2 cm posterior wall tumor. 3. Transurethral resection of the prostate.  PREOPERATIVE DIAGNOSIS:  Bladder tumors from bladder neck and posterior wall with benign prostatic hypertrophy with bladder outlet obstruction.  POSTOPERATIVE DIAGNOSIS:  Bladder tumors from bladder neck and posterior wall with benign prostatic hypertrophy with bladder outlet obstruction.  SURGEON:  Marshall Cork. Jeffie Pollock, M.D.  ANESTHESIA:  General.  SPECIMEN:  Chips from the bladder neck tumor and prostate chips.  DRAINS:  A 22-French 3-way Foley catheter.  BLOOD LOSS:  Minimal.  COMPLICATIONS:  None.  INDICATIONS:  [name] is an 83 year old white male with history of bladder cancer, who was found to have recurrences on surveillance cystoscopy on the posterior wall and on the bladder neck over his prostate middle lobe.  It was felt that resection of the tumors and transurethral resection of prostate were indicated.  FINDINGS AND PROCEDURE:  He was taken to the operating room where he was given antibiotic.  General anesthetic was induced.  He was placed in lithotomy position.  His perineum and genitalia were prepped with Betadine solution.  He had been fitted with PAS hose.  He was draped in usual sterile fashion.  Cystoscopy was performed using the 23-French scope and 30-degree lens. Examination revealed a normal urethra.  The external sphincter was intact.  The prostatic urethra was approximately 4 cm in length with trilobar hyperplasia with a large middle lobe.  The  mucosa over the middle lobe and circumferentially around the bladder neck for a total maximum tumor diameter of greater than 5 cm was a carpet of papillary fronds consistent with urothelial carcinoma versus carcinoma in situ. The tumor extended approximately 1 cm to 1.5 cm in from the bladder neck, depending on the location with deepest extent anteriorly and toward left ureteral orifices.  There was also 1.5 to 2 cm patch of erythema with a central papillary structure on the posterior wall consistent with urothelial carcinoma with probable carcinoma in situ. No stones were seen.  Ureteral orifices were in the normal anatomic position well away from the bladder neck effluxing clear urine.  The cystoscope was removed and a 26-French continuous flow resectoscope sheath was inserted.  This was fitted with an Beatrix Fetters handle with a bipolar loop and a 30-degree lens.  Saline was used as the irrigant.  Initially, the tumor over the middle lobe and lateral bladder neck was resected and the specimen was collected for pathologic analysis.  I then performed the TURP.  The resection continued with completion of the middle lobe resection followed by creation of a trough along the floor out to alongside the verumontanum.  The left lobe of the  prostate was resected from bladder neck to apex out to the capsular fibers.  This was followed by the right lobe.  The bladder was evacuated free of chips and some residual apical and anterior tissue was resected.  Those chips were removed and hemostasis was achieved.  I then fulgurated the posterior wall tumor.  Because of penile prosthesis and the patient's anatomy, I did not feel I could safely resect the tumor for fear of bladder injury, but since I had adequate specimen from the bladder neck and the posterior wall tumors seen appeared similar in appearance, I generously fulgurated the area of abnormal mucosa in the central papillary frond.  I then  inspected the bladder neck again and this was when I identified the greater extent of the papillary tumor fronds, particularly anteriorly and toward the left ureteral orifice.  These areas were resected and fulgurated as accessible to its complete and extent as possible.  Once all these areas had been fulgurated, the bladder was inspected.  No retained chips were noted.  Final hemostasis was achieved.  The scope was removed.  Pressure on the bladder produced an excellent flow.  A 22- French 3-way Foley catheter was inserted with the aid of a catheter guide.  The guide was removed.  The balloon was filled with 30 mL of sterile fluid.  The catheter was irrigated with clear return and placed on continuous irrigation and straight drainage.  The patient was taken down from lithotomy position.  His anesthetic was reversed.  He was moved to recovery room in stable condition.  There were no complications.     Marshall Cork. Jeffie Pollock, M.D.     JJW/MEDQ  D:  11/08/2016  T:  11/08/2016  Job:  [id number]

## 2016-11-08 NOTE — Brief Op Note (Signed)
11/08/2016  12:56 PM  PATIENT:  Jay Scott  83 y.o. male  PRE-OPERATIVE DIAGNOSIS:  BENIGN PROSTATIC HYPERPLASIA, BLADDER OUTLET OBSTRUCTION, BLADDER TUMORS POSTERIOR WALL AND BLADDER NECK  POST-OPERATIVE DIAGNOSIS:  BENIGN PROSTATIC HYPERPLASIA, BLADDER OUTLET OBSTRUCTION, BLADDER TUMORS >5cm BN and 2cm posterior wall.  PROCEDURE:  Procedure(s): TRANSURETHRAL RESECTION OF THE PROSTATE (TURP) (N/A) TRANSURETHRAL RESECTION OF BLADDER TUMOR (TURBT) (N/A)  LARGE BN SURGEON:  Surgeon(s) and Role:    * Irine Seal, MD - Primary  PHYSICIAN ASSISTANT:   ASSISTANTS: none   ANESTHESIA:   general  EBL:  50 mL   BLOOD ADMINISTERED:none  DRAINS: Urinary Catheter (Foley)   LOCAL MEDICATIONS USED:  NONE  SPECIMEN:  Source of Specimen:  bladder tumor from BN and prostate chips  DISPOSITION OF SPECIMEN:  PATHOLOGY  COUNTS:  YES  TOURNIQUET:  * No tourniquets in log *  DICTATION: .Other Dictation: Dictation Number [id number]  PLAN OF CARE: Admit for overnight observation  PATIENT DISPOSITION:  PACU - hemodynamically stable.   Delay start of Pharmacological VTE agent (>24hrs) due to surgical blood loss or risk of bleeding: yes

## 2016-11-08 NOTE — Interval H&P Note (Signed)
History and Physical Interval Note:  11/08/2016 11:33 AM  Jay Scott  has presented today for surgery, with the diagnosis of BENIGN PROSTATIC HYPERPLASIA, BLADDER OUTLET OBSTRUCTION, BLADDER TUMORS  The various methods of treatment have been discussed with the patient and family. After consideration of risks, benefits and other options for treatment, the patient has consented to  Procedure(s): TRANSURETHRAL RESECTION OF THE PROSTATE (TURP) (N/A) TRANSURETHRAL RESECTION OF BLADDER TUMOR (TURBT) (N/A) CYSTOSCOPY WITH RETROGRADE PYELOGRAM (Bilateral) as a surgical intervention .  The patient's history has been reviewed, patient examined, no change in status, stable for surgery.  I have reviewed the patient's chart and labs.  Questions were answered to the patient's satisfaction.     Durga Saldarriaga J

## 2016-11-08 NOTE — Transfer of Care (Signed)
Immediate Anesthesia Transfer of Care Note  Patient: Jay Scott  Procedure(s) Performed: TRANSURETHRAL RESECTION OF THE PROSTATE (TURP) (N/A ) TRANSURETHRAL RESECTION OF BLADDER TUMOR (TURBT) (N/A )  Patient Location: PACU  Anesthesia Type:General  Level of Consciousness: awake, alert  and oriented  Airway & Oxygen Therapy: Patient Spontanous Breathing and Patient connected to face mask oxygen  Post-op Assessment: Report given to RN and Post -op Vital signs reviewed and stable  Post vital signs: Reviewed and stable  Last Vitals: There were no vitals filed for this visit.  Last Pain: There were no vitals filed for this visit.    Patients Stated Pain Goal: 3 (16/10/96 0454)  Complications: No apparent anesthesia complications

## 2016-11-08 NOTE — Anesthesia Preprocedure Evaluation (Addendum)
Anesthesia Evaluation  Patient identified by MRN, date of birth, ID band Patient awake    Reviewed: Allergy & Precautions, H&P , NPO status , Patient's Chart, lab work & pertinent test results  Airway Mallampati: II  TM Distance: >3 FB Neck ROM: Full    Dental no notable dental hx. (+) Teeth Intact, Dental Advisory Given   Pulmonary neg pulmonary ROS,    Pulmonary exam normal breath sounds clear to auscultation       Cardiovascular hypertension, Pt. on medications  Rhythm:Regular Rate:Normal     Neuro/Psych Anxiety negative neurological ROS  negative psych ROS   GI/Hepatic Neg liver ROS, GERD  Medicated and Controlled,  Endo/Other  negative endocrine ROS  Renal/GU negative Renal ROS  negative genitourinary   Musculoskeletal   Abdominal   Peds  Hematology negative hematology ROS (+)   Anesthesia Other Findings   Reproductive/Obstetrics negative OB ROS                            Anesthesia Physical Anesthesia Plan  ASA: II  Anesthesia Plan: General   Post-op Pain Management:    Induction: Intravenous  PONV Risk Score and Plan: 3 and Ondansetron, Dexamethasone and Treatment may vary due to age or medical condition  Airway Management Planned: LMA and Oral ETT  Additional Equipment:   Intra-op Plan:   Post-operative Plan: Extubation in OR  Informed Consent: I have reviewed the patients History and Physical, chart, labs and discussed the procedure including the risks, benefits and alternatives for the proposed anesthesia with the patient or authorized representative who has indicated his/her understanding and acceptance.   Dental advisory given  Plan Discussed with: CRNA  Anesthesia Plan Comments:         Anesthesia Quick Evaluation

## 2016-11-08 NOTE — Anesthesia Postprocedure Evaluation (Signed)
Anesthesia Post Note  Patient: ESSIE GEHRET  Procedure(s) Performed: TRANSURETHRAL RESECTION OF THE PROSTATE (TURP) (N/A ) TRANSURETHRAL RESECTION OF BLADDER TUMOR (TURBT) (N/A )     Patient location during evaluation: PACU Anesthesia Type: General Level of consciousness: awake and alert Pain management: pain level controlled Vital Signs Assessment: post-procedure vital signs reviewed and stable Respiratory status: spontaneous breathing, nonlabored ventilation and respiratory function stable Cardiovascular status: blood pressure returned to baseline and stable Postop Assessment: no apparent nausea or vomiting Anesthetic complications: no    Last Vitals:  Vitals:   11/08/16 1400 11/08/16 1415  BP: 129/75 130/68  Pulse: 66 64  Resp: (!) 24 (!) 24  Temp:  36.7 C  SpO2: 94% 94%    Last Pain:  Vitals:   11/08/16 1345  PainSc: Asleep                 Delva Derden,W. EDMOND

## 2016-11-09 ENCOUNTER — Encounter (HOSPITAL_COMMUNITY): Payer: Self-pay | Admitting: Urology

## 2016-11-09 DIAGNOSIS — C675 Malignant neoplasm of bladder neck: Secondary | ICD-10-CM | POA: Diagnosis not present

## 2016-11-09 LAB — HEMOGLOBIN AND HEMATOCRIT, BLOOD
HEMATOCRIT: 39.4 % (ref 39.0–52.0)
HEMOGLOBIN: 14 g/dL (ref 13.0–17.0)

## 2016-11-09 NOTE — Discharge Summary (Signed)
Patient ID: GLENARD KEESLING MRN: [id number] DOB/AGE: 1931-03-19 83 y.o.  Admit date: 11/08/2016 Discharge date: 11/09/2016  Primary Care Physician:  System, Pcp Not In  Discharge Diagnoses:  Bladder cancer BPH with obstruction Present on Admission: . BPH with obstruction/lower urinary tract symptoms   Consults:  None   Discharge Medications: Allergies as of 11/09/2016      Reactions   Lactose Intolerance (gi)    Bloating, gas      Medication List    TAKE these medications   acetaminophen 650 MG CR tablet Commonly known as:  TYLENOL Take 1,300 mg by mouth 2 (two) times daily.   citalopram 10 MG tablet Commonly known as:  CELEXA Take 10 mg by mouth daily.   cycloSPORINE 0.05 % ophthalmic emulsion Commonly known as:  RESTASIS Place 1 drop into both eyes 2 (two) times daily as needed (dry eyes).   docusate sodium 100 MG capsule Commonly known as:  COLACE Take 1 capsule (100 mg total) by mouth 2 (two) times daily.   ENSURE Take 237 mLs by mouth daily.   losartan 100 MG tablet Commonly known as:  COZAAR Take 100 mg by mouth daily.   spironolactone 25 MG tablet Commonly known as:  ALDACTONE Take 25 mg by mouth daily.   traMADol 50 MG tablet Commonly known as:  ULTRAM Take 1 tablet (50 mg total) by mouth every 6 (six) hours as needed.        Significant Diagnostic Studies:  No results found.  Brief H and P: For complete details please refer to admission H and P, but in brief the pt was admitted for mgmt of obstructive BPH and a large bladder tumor.  Hospital Course: The pt underwent TURP as well as TURP on th day of admission. Postop course was uncomplicated and he left on POD 1 Active Problems:   BPH with obstruction/lower urinary tract symptoms   Day of Discharge BP 136/72 (BP Location: Left Arm)   Pulse (!) 52   Temp 98.4 F (36.9 C) (Oral)   Resp 18   Ht 5' 8.5" (1.74 m)   Wt 75.3 kg (166 lb 0.1 oz)   SpO2 94%   BMI 24.87 kg/m    Results for orders placed or performed during the hospital encounter of 11/08/16 (from the past 24 hour(s))  Hemoglobin and hematocrit, blood     Status: None   Collection Time: 11/09/16  4:39 AM  Result Value Ref Range   Hemoglobin 14.0 13.0 - 17.0 g/dL   HCT 39.4 39.0 - 52.0 %    Physical Exam: General: Alert and awake oriented x3 not in any acute distress. HEENT: anicteric sclera, pupils reactive to light and accommodation CVS: S1-S2 clear no murmur rubs or gallops Chest: clear to auscultation bilaterally, no wheezing rales or rhonchi Abdomen: soft nontender, nondistended, normal bowel sounds, no organomegaly Extremities: no cyanosis, clubbing or edema noted bilaterally Neuro: Cranial nerves II-XII intact, no focal neurological deficits  Disposition:  Home  Diet:  Regular  Activity:  Gradually increase     TESTS THAT NEED FOLLOW-UP  Path review  DISCHARGE FOLLOW-UP Follow-up Information    Irine Seal, MD Follow up on 11/22/2016.   Specialty:  Urology Why:  as scheduled. Contact information: [place] [address] [phone]           Time spent on Discharge:  10 mins  Signed: Jorja Loa 11/09/2016, 7:46 AM

## 2017-01-16 IMAGING — MR MR HEAD WO/W CM
10 of 13 series · 34 of 48 positions shown · IV contrast (multihance)
Comparison: CT same day

CLINICAL DATA: History of meningioma resection 11 years ago. Visual
field defect on the left newly discovered.

EXAM:
MRI HEAD WITHOUT AND WITH CONTRAST
TECHNIQUE: Multiplanar, multiecho pulse sequences of the brain and surrounding
structures were obtained without and with intravenous contrast.
CONTRAST:  17mL MULTIHANCE GADOBENATE DIMEGLUMINE 529 MG/ML IV SOLN

[Series 3: DWI · axial · 3.0mm · 1.09mm/px · z∈[-38,+115]mm · 9 of 104 slices shown (1 of 4)]
[im 1/104]
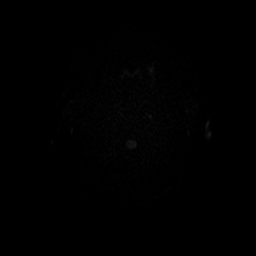
[im 13/104]
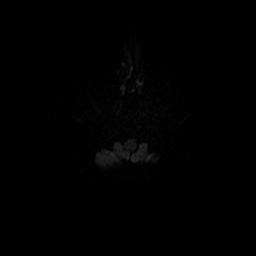
[im 26/104]
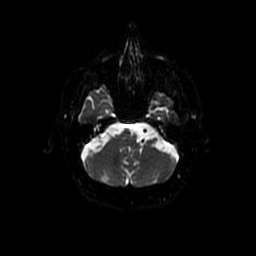
[im 39/104]
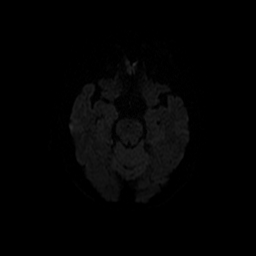
[im 52/104]
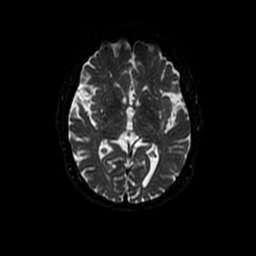
[im 65/104]
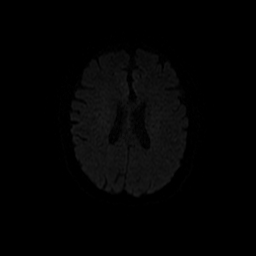
[im 78/104]
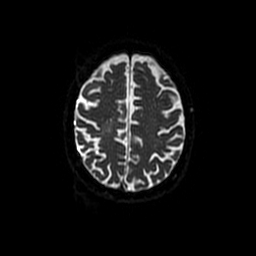
[im 91/104]
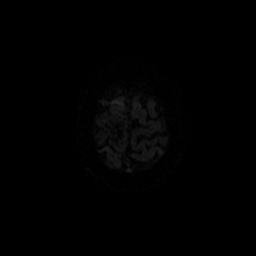
[im 104/104]
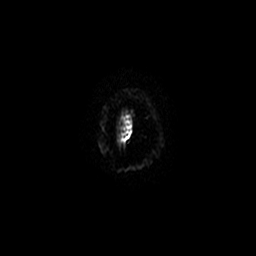

[Series 4: T1 · sagittal · 5.0mm · 0.47mm/px · 2 of 23 slices shown]
[im 1/23]
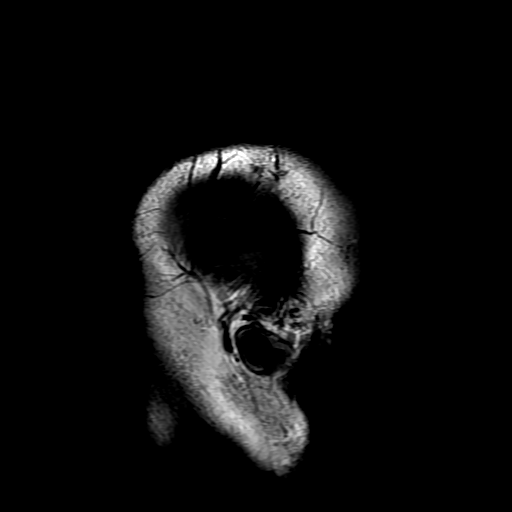
[im 23/23]
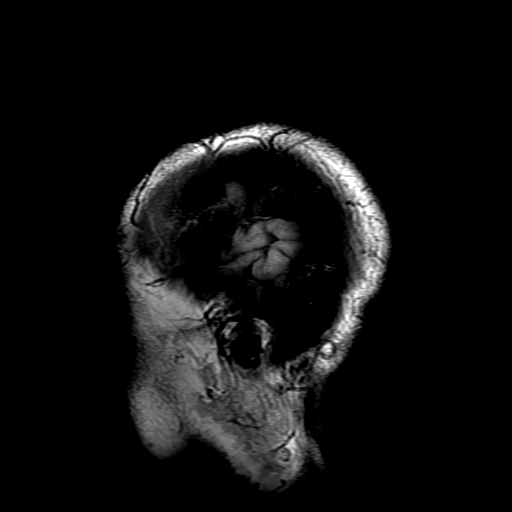

[Series 5: T2 · axial · 5.0mm · 0.43mm/px · z∈[-40,+110]mm · 2 of 26 slices shown]
[im 1/26]
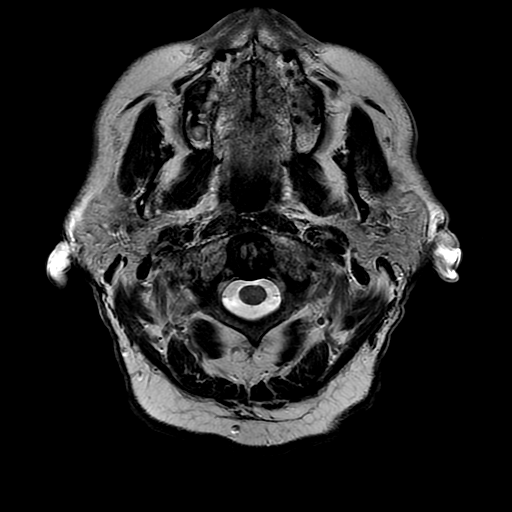
[im 26/26]
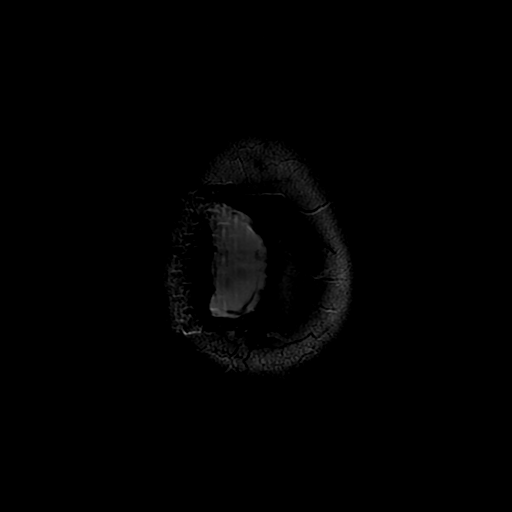

[Series 6: FLAIR · axial · 5.0mm · 0.43mm/px · z∈[-40,+110]mm · 2 of 26 slices shown]
[im 1/26]
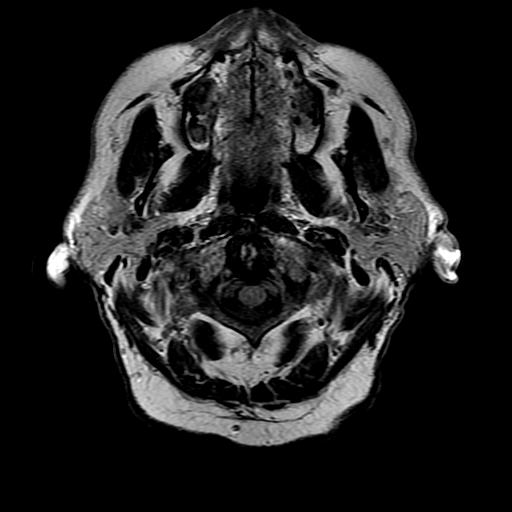
[im 26/26]
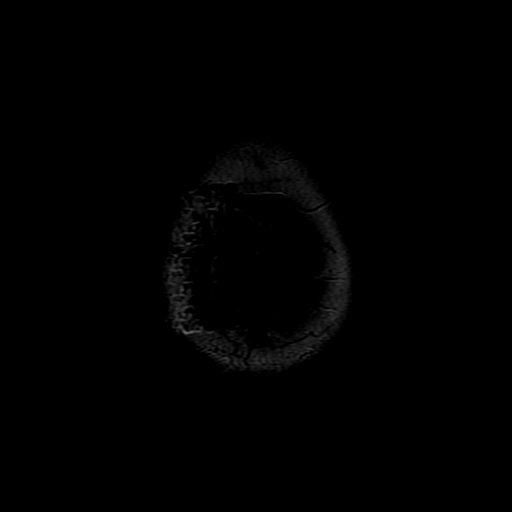

[Series 9: DWI · coronal · 5.0mm · 1.09mm/px · 6 of 70 slices shown (2 of 4)]
[im 1/70]
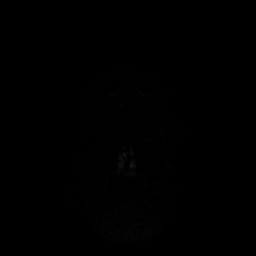
[im 14/70]
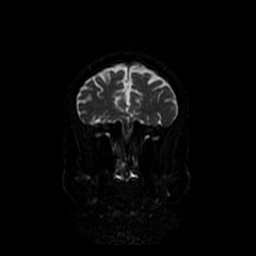
[im 28/70]
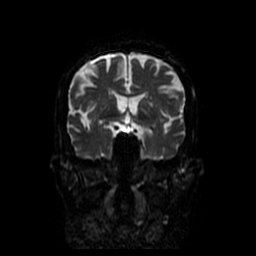
[im 42/70]
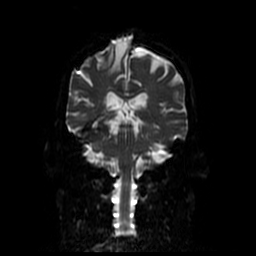
[im 56/70]
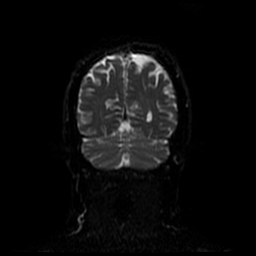
[im 70/70]
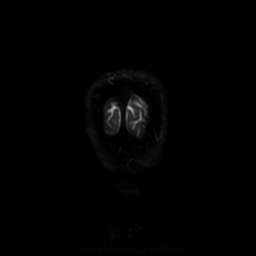

[Series 10: T2 post-contrast · coronal · 5.0mm · 0.39mm/px · 2 of 27 slices shown]
[im 1/27]
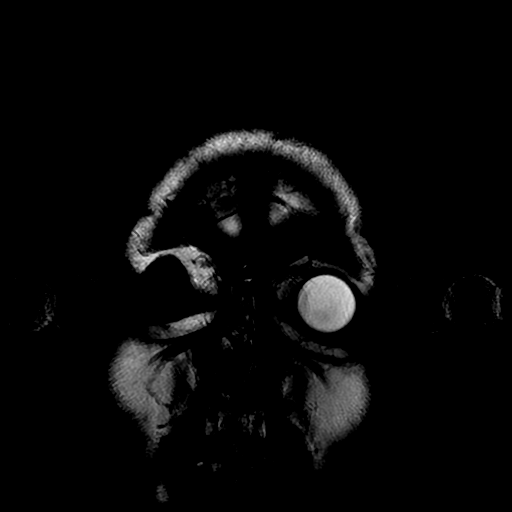
[im 27/27]
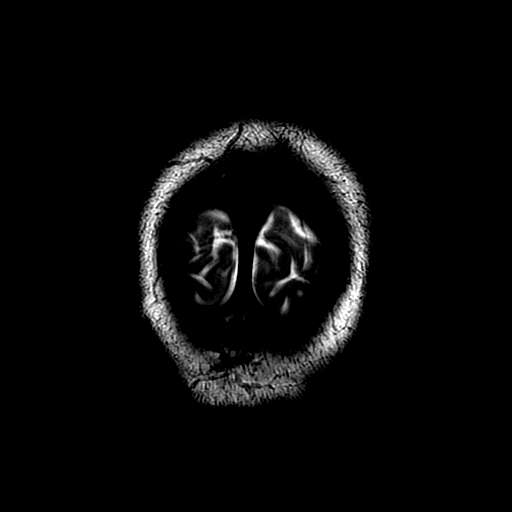

[Series 12: T1 post-contrast · coronal · 5.0mm · 0.39mm/px · 2 of 27 slices shown (1 of 2)]
[im 1/27]
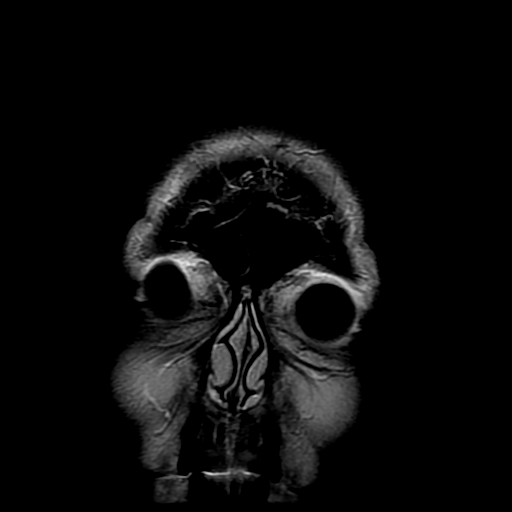
[im 27/27]
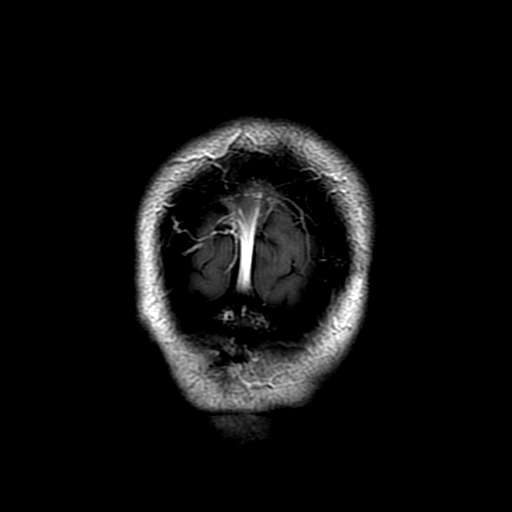

[Series 13: T1 post-contrast · sagittal · 5.0mm · 0.47mm/px · 2 of 23 slices shown (2 of 2)]
[im 1/23]
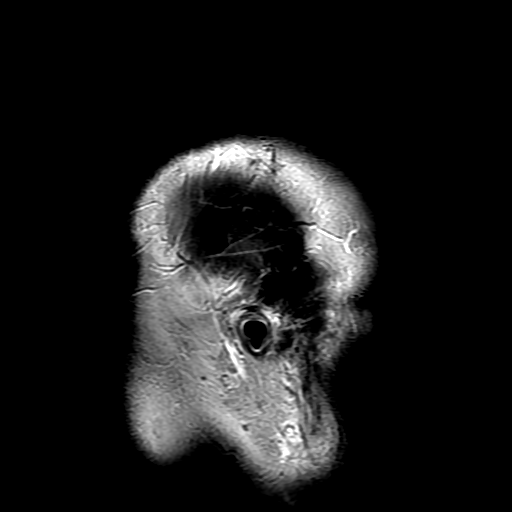
[im 23/23]
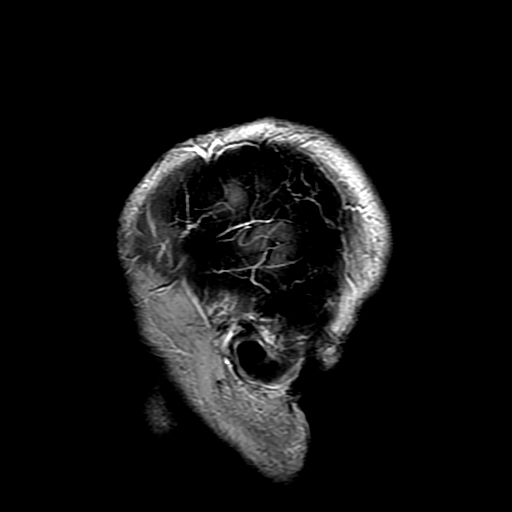

[Series 300: DWI · axial · 3.0mm · 1.09mm/px · z∈[-38,+115]mm · 4 of 52 slices shown (3 of 4)]
[im 1/52]
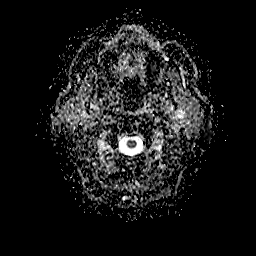
[im 18/52]
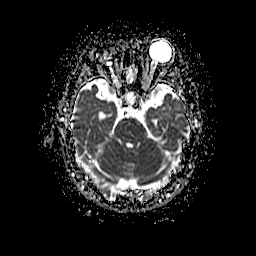
[im 35/52]
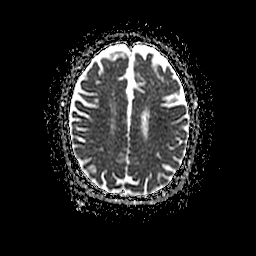
[im 52/52]
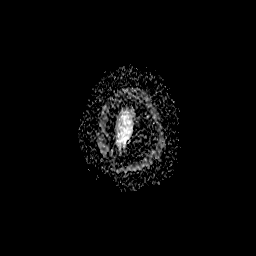

[Series 900: DWI · coronal · 5.0mm · 1.09mm/px · 3 of 35 slices shown (4 of 4)]
[im 1/35]
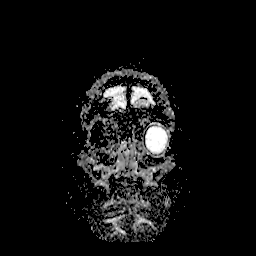
[im 18/35]
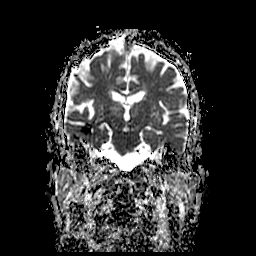
[im 35/35]
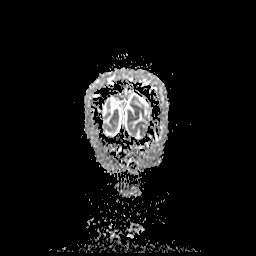

[34 of 48 positions shown; findings below may reference images not displayed]

FINDINGS: Diffusion imaging does not show any acute or subacute infarction.
There chronic small-vessel ischemic changes of the pons. No focal
cerebellar insult. Cerebral hemispheres show atrophy with moderate
chronic small-vessel ischemic changes of the thalami and the deep
and subcortical white matter. There is been previous right parietal
craniotomy for resection of meningioma. There is atrophy and gliosis
of the underlying right parietal vertex. No evidence of residual or
recurrent tumor. Superior sagittal sinus is patent. No second
meningioma is identified.

Previous enucleation on the right with prosthesis. No gross
abnormality of the left globe or orbital contents.

No pituitary mass.  No inflammatory sinus disease.
IMPRESSION: Chronic small-vessel ischemic changes affecting the pons, thalami
and cerebral hemispheric white matter.

Previous resection of meningioma at the right parietal vertex.
Underlying atrophy and gliosis. No residual or recurrent tumor.

Old enucleation on the right with prosthesis.

No specific explanation for left visual field defect.
